# Patient Record
Sex: Male | Born: 1995 | Race: White | Hispanic: No | Marital: Married | Smoking: Never smoker
Health system: Southern US, Community
[De-identification: ages and names within clinical notes are randomized; demographics above are authoritative.]

## PROBLEM LIST (undated history)

## (undated) DIAGNOSIS — R002 Palpitations: Secondary | ICD-10-CM

## (undated) DIAGNOSIS — S42409B Unspecified fracture of lower end of unspecified humerus, initial encounter for open fracture: Secondary | ICD-10-CM

## (undated) DIAGNOSIS — I10 Essential (primary) hypertension: Secondary | ICD-10-CM

## (undated) DIAGNOSIS — R569 Unspecified convulsions: Secondary | ICD-10-CM

## (undated) DIAGNOSIS — T148XXA Other injury of unspecified body region, initial encounter: Secondary | ICD-10-CM

## (undated) DIAGNOSIS — R000 Tachycardia, unspecified: Secondary | ICD-10-CM

## (undated) DIAGNOSIS — S060X9A Concussion with loss of consciousness of unspecified duration, initial encounter: Secondary | ICD-10-CM

## (undated) HISTORY — DX: Tachycardia, unspecified: R00.0

## (undated) HISTORY — DX: Palpitations: R00.2

## (undated) HISTORY — DX: Essential (primary) hypertension: I10

---
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
## 2017-08-02 DIAGNOSIS — Z0001 Encounter for general adult medical examination with abnormal findings: Secondary | ICD-10-CM | POA: Diagnosis not present

## 2017-08-02 DIAGNOSIS — R251 Tremor, unspecified: Secondary | ICD-10-CM | POA: Diagnosis not present

## 2017-08-26 DIAGNOSIS — R61 Generalized hyperhidrosis: Secondary | ICD-10-CM | POA: Diagnosis not present

## 2017-09-08 DIAGNOSIS — I499 Cardiac arrhythmia, unspecified: Secondary | ICD-10-CM | POA: Diagnosis not present

## 2017-09-19 ENCOUNTER — Telehealth: Payer: Self-pay

## 2017-09-19 DIAGNOSIS — R002 Palpitations: Secondary | ICD-10-CM

## 2017-09-19 HISTORY — DX: Palpitations: R00.2

## 2017-09-19 NOTE — Telephone Encounter (Signed)
[facility]20[facility]17 with Dr Delton Seenelson.  Can not read holter report tried to call to request this again, office was closed.  Left note for Greta DoomRose Jacobs in chart prep to call in the morning to have these resent.

## 2017-10-06 ENCOUNTER — Encounter: Payer: Self-pay | Admitting: Cardiology

## 2017-10-14 ENCOUNTER — Ambulatory Visit: Payer: BLUE CROSS/BLUE SHIELD | Admitting: Cardiology

## 2017-10-14 ENCOUNTER — Encounter: Payer: Self-pay | Admitting: Cardiology

## 2017-10-14 ENCOUNTER — Encounter (INDEPENDENT_AMBULATORY_CARE_PROVIDER_SITE_OTHER): Payer: Self-pay

## 2017-10-14 VITALS — BP 138/90 | HR 78 | Ht 73.0 in | Wt 177.4 lb

## 2017-10-14 DIAGNOSIS — R002 Palpitations: Secondary | ICD-10-CM | POA: Diagnosis not present

## 2017-10-14 DIAGNOSIS — I10 Essential (primary) hypertension: Secondary | ICD-10-CM

## 2017-10-14 NOTE — Patient Instructions (Signed)
Medication Instructions:  Your provider recommends that you continue on your current medications as directed. Please refer to the Current Medication list given to you today.    Labwork: None  Testing/Procedures: Your provider has requested that you have an echocardiogram. Echocardiography is a painless test that uses sound waves to create images of your heart. It provides your doctor with information about the size and shape of your heart and how well your heart's chambers and valves are working. This procedure takes approximately one hour. There are no restrictions for this procedure.    Your physician has recommended that you wear an event monitor. Event monitors are medical devices that record the heart's electrical activity. Doctors most often us these monitors to diagnose arrhythmias. Arrhythmias are problems with the speed or rhythm of the heartbeat. The monitor is a small, portable device. You can wear one while you do your normal daily activities. This is usually used to diagnose what is causing palpitations/syncope (passing out).  Follow-Up: Your provider recommends that you schedule a follow-up appointment in 3 MONTHS with Dr. Delton SeeNelson.  Any Other Special Instructions Will Be Listed Below (If Applicable).     If you need a refill on your cardiac medications before your next appointment, please call your pharmacy.
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
## 2017-10-24 ENCOUNTER — Ambulatory Visit (HOSPITAL_COMMUNITY): Payer: BLUE CROSS/BLUE SHIELD | Attending: Cardiovascular Disease

## 2017-10-24 ENCOUNTER — Other Ambulatory Visit: Payer: Self-pay

## 2017-10-24 ENCOUNTER — Ambulatory Visit (INDEPENDENT_AMBULATORY_CARE_PROVIDER_SITE_OTHER): Payer: BLUE CROSS/BLUE SHIELD

## 2017-10-24 DIAGNOSIS — R002 Palpitations: Secondary | ICD-10-CM

## 2017-10-24 DIAGNOSIS — I10 Essential (primary) hypertension: Secondary | ICD-10-CM | POA: Diagnosis not present

## 2017-11-13 ENCOUNTER — Telehealth: Payer: Self-pay | Admitting: Cardiology

## 2017-11-13 NOTE — Telephone Encounter (Signed)
Received a call from monitor company that patient had a 7 sec run of NSVT (180 bpm) this morning around 5am. I attempted to contact the patient through the number listed in Epic which was his parents number. Father reports he is out of town working and gave cell number. Attempted to call, and VM left to call the office on call number. Will attempt to reach again.   Laverda PageLindsay Roberts NP

## 2017-11-14 NOTE — Telephone Encounter (Addendum)
Pt out of town working.  Called him on cell phone [phone]. Pt reports sleeping at time of event occurrence yesterday morning.  No symptoms reported, was not woken from sleep. He did report triggering an event on Saturday.  Informed that I would pull and review w/ Dr. Delton SeeNelson.   Monitor from Saturday 3:41 pm auto triggered event showing 4 beat VT, rate 197.  Pt reports feeling flutter/skipped beats/palpitations. Dr. Delton SeeNelson recommends EP consult in 1-2 weeks with Dr. Ladona Ridgelaylor.  Will have scheduler arrange w/ pt.   Pt aware office will call to arrange

## 2017-12-09 ENCOUNTER — Encounter: Payer: Self-pay | Admitting: Internal Medicine

## 2017-12-09 ENCOUNTER — Ambulatory Visit: Payer: BLUE CROSS/BLUE SHIELD | Admitting: Internal Medicine

## 2017-12-09 ENCOUNTER — Telehealth: Payer: Self-pay

## 2017-12-09 VITALS — BP 134/72 | HR 85 | Ht 73.0 in | Wt 179.0 lb

## 2017-12-09 DIAGNOSIS — R002 Palpitations: Secondary | ICD-10-CM

## 2017-12-09 DIAGNOSIS — I10 Essential (primary) hypertension: Secondary | ICD-10-CM

## 2017-12-09 NOTE — Telephone Encounter (Signed)
Left message for medical records at Dr. Carolee RotaPharr's office, requesting last 12 lead EKG for this Pt.  Left fax #.  Also requested our MR dept request EKG.

## 2017-12-09 NOTE — Patient Instructions (Addendum)
Medication Instructions:  Your physician recommends that you continue on your current medications as directed. Please refer to the Current Medication list given to you today.  Labwork: None ordered.   Testing/Procedures: None ordered.  Follow-Up: Your physician wants you to follow-up in: 6 months with Dr. Ladona Ridgelaylor.     Any Other Special Instructions Will Be Listed Below (If Applicable).  Avoid caffeine.  If you need a refill on your cardiac medications before your next appointment, please call your pharmacy.
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
## 2018-02-06 ENCOUNTER — Ambulatory Visit: Payer: BLUE CROSS/BLUE SHIELD | Admitting: Cardiology

## 2018-05-26 ENCOUNTER — Ambulatory Visit: Payer: BLUE CROSS/BLUE SHIELD | Admitting: Internal Medicine

## 2018-11-14 DIAGNOSIS — T1592XA Foreign body on external eye, part unspecified, left eye, initial encounter: Secondary | ICD-10-CM | POA: Diagnosis not present

## 2019-06-27 DIAGNOSIS — J029 Acute pharyngitis, unspecified: Secondary | ICD-10-CM | POA: Diagnosis not present

## 2023-07-29 ENCOUNTER — Ambulatory Visit (INDEPENDENT_AMBULATORY_CARE_PROVIDER_SITE_OTHER): Payer: BC Managed Care – PPO | Admitting: Physician Assistant

## 2023-07-29 ENCOUNTER — Encounter: Payer: Self-pay | Admitting: Physician Assistant

## 2023-07-29 VITALS — BP 168/102 | HR 114 | Temp 98.5°F | Ht 73.0 in | Wt 215.0 lb

## 2023-07-29 DIAGNOSIS — Z0000 Encounter for general adult medical examination without abnormal findings: Secondary | ICD-10-CM | POA: Diagnosis not present

## 2023-07-29 DIAGNOSIS — I10 Essential (primary) hypertension: Secondary | ICD-10-CM | POA: Diagnosis not present

## 2023-07-29 MED ORDER — VALSARTAN 80 MG PO TABS: 80.0000 mg | ORAL_TABLET | Freq: Every day | ORAL | 1 refills | Status: DC

## 2023-07-29 NOTE — Assessment & Plan Note (Addendum)
Suspicion 2/2 to anxiety, but h/o of medication use -will start with valsartan 80 mg , advised pt to monitor blood pressure at home -ordered cmp F/u 4 weeks
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
## 2023-08-01 LAB — LIPID PANEL
Chol/HDL Ratio: 2.5 ratio (ref 0.0–5.0)
Cholesterol, Total: 214 mg/dL — ABNORMAL HIGH (ref 100–199)
HDL: 86 mg/dL (ref >39)
LDL Chol Calc (NIH): 115 mg/dL — ABNORMAL HIGH (ref 0–99)
Triglycerides: 74 mg/dL (ref 0–149)
VLDL Cholesterol Cal: 13 mg/dL (ref 5–40)

## 2023-08-01 LAB — HEMOGLOBIN A1C
Est. average glucose Bld gHb Est-mCnc: 111 mg/dL
Hgb A1c MFr Bld: 5.5 % (ref 4.8–5.6)

## 2023-08-01 LAB — CBC WITH DIFFERENTIAL/PLATELET
Basophils Absolute: 0.1 10*3/uL (ref 0.0–0.2)
Basos: 1 %
EOS (ABSOLUTE): 0.1 10*3/uL (ref 0.0–0.4)
Eos: 1 %
Hematocrit: 50.3 % (ref 37.5–51.0)
Hemoglobin: 16.2 g/dL (ref 13.0–17.7)
Immature Grans (Abs): 0 10*3/uL (ref 0.0–0.1)
Immature Granulocytes: 0 %
Lymphocytes Absolute: 1.8 10*3/uL (ref 0.7–3.1)
Lymphs: 20 %
MCH: 30.9 pg (ref 26.6–33.0)
MCHC: 32.2 g/dL (ref 31.5–35.7)
MCV: 96 fL (ref 79–97)
Monocytes Absolute: 0.7 10*3/uL (ref 0.1–0.9)
Monocytes: 8 %
Neutrophils Absolute: 6.4 10*3/uL (ref 1.4–7.0)
Neutrophils: 70 %
Platelets: 293 10*3/uL (ref 150–450)
RBC: 5.25 x10E6/uL (ref 4.14–5.80)
RDW: 12 % (ref 11.6–15.4)
WBC: 9 10*3/uL (ref 3.4–10.8)

## 2023-08-01 LAB — COMPREHENSIVE METABOLIC PANEL
ALT: 19 [IU]/L (ref 0–44)
AST: 18 [IU]/L (ref 0–40)
Albumin: 4.9 g/dL (ref 4.3–5.2)
Alkaline Phosphatase: 77 [IU]/L (ref 44–121)
BUN/Creatinine Ratio: 11 (ref 9–20)
BUN: 11 mg/dL (ref 6–20)
Bilirubin Total: 0.3 mg/dL (ref 0.0–1.2)
CO2: 22 mmol/L (ref 20–29)
Calcium: 10.2 mg/dL (ref 8.7–10.2)
Chloride: 101 mmol/L (ref 96–106)
Creatinine, Ser: 0.99 mg/dL (ref 0.76–1.27)
Globulin, Total: 2.6 g/dL (ref 1.5–4.5)
Glucose: 106 mg/dL — ABNORMAL HIGH (ref 70–99)
Potassium: 4.5 mmol/L (ref 3.5–5.2)
Sodium: 139 mmol/L (ref 134–144)
Total Protein: 7.5 g/dL (ref 6.0–8.5)
eGFR: 107 mL/min/{1.73_m2} (ref >59)

## 2023-10-03 ENCOUNTER — Other Ambulatory Visit: Payer: Self-pay | Admitting: Physician Assistant

## 2023-10-03 DIAGNOSIS — I10 Essential (primary) hypertension: Secondary | ICD-10-CM

## 2024-02-21 ENCOUNTER — Telehealth: Payer: Self-pay | Admitting: Physician Assistant

## 2024-02-21 DIAGNOSIS — I10 Essential (primary) hypertension: Secondary | ICD-10-CM

## 2024-02-21 NOTE — Telephone Encounter (Signed)
 Copied from CRM [id number]. Topic: Clinical - Medication Refill >> Feb 21, 2024 11:58 AM Baldo Levan wrote: Medication:  valsartan  valsartan  (DIOVAN ) 80 MG tablet  Patient leaves tomorrow morning for a trip and will run out while out of town.   Has the patient contacted their pharmacy? Yes (Agent: If no, request that the patient contact the pharmacy for the refill. If patient does not wish to contact the pharmacy document the reason why and proceed with request.) (Agent: If yes, when and what did the pharmacy advise?)  This is the patient's preferred pharmacy:    [id number] - [place], [place] - [address] AT [facility] [address] [place] [address] Phone: [phone] Fax: [phone]  Is this the correct pharmacy for this prescription? Yes If no, delete pharmacy and type the correct one.   Has the prescription been filled recently? No  Is the patient out of the medication? Yes  Has the patient been seen for an appointment in the last year OR does the patient have an upcoming appointment? Yes  Can we respond through MyChart? Yes  Agent: Please be advised that Rx refills may take up to 3 business days. We ask that you follow-up with your pharmacy.

## 2024-02-22 MED ORDER — VALSARTAN 80 MG PO TABS: 80.0000 mg | ORAL_TABLET | Freq: Every day | ORAL | 0 refills | Status: DC

## 2024-02-22 NOTE — Progress Notes (Signed)
 Established patient visit   Patient: Randy Daniels   DOB: Aug 23, 1996   28 y.o. Male  MRN: [id number] Visit Date: 02/29/2024  Today's healthcare provider: Manuelita Flatness, PA-C   Cc. Htn fu  Subjective     Hypertension, follow-up  BP Readings from Last 3 Encounters:  02/29/24 (!) 142/100  07/29/23 (!) 168/102  12/09/17 134/72   Wt Readings from Last 3 Encounters:  02/29/24 210 lb 12.8 oz (95.6 kg)  07/29/23 215 lb (97.5 kg)  12/09/17 179 lb (81.2 kg)     Pt was last seen 07/29/23, started on valsartan  80 mg and advised to f/b in 4 weeks.   Outside blood pressures are 126/80; 137/82 but last checked 08/2023.  Pertinent labs Lab Results  Component Value Date   CHOL 214 (H) 07/29/2023   HDL 86 07/29/2023   LDLCALC 115 (H) 07/29/2023   TRIG 74 07/29/2023   CHOLHDL 2.5 07/29/2023   Lab Results  Component Value Date   NA 139 07/29/2023   K 4.5 07/29/2023   CREATININE 0.99 07/29/2023   EGFR 107 07/29/2023   GLUCOSE 106 (H) 07/29/2023     The ASCVD Risk score (Arnett DK, et al., 2019) failed to calculate for the following reasons:   The 2019 ASCVD risk score is only valid for ages 30 to 54  ---------------------------------------------------------------------------------------------------   Medications: Outpatient Medications Prior to Visit  Medication Sig   acetaminophen  (TYLENOL ) 325 MG tablet Take 325 mg by mouth every 6 (six) hours as needed. For pain   [DISCONTINUED] valsartan  (DIOVAN ) 80 MG tablet Take 1 tablet (80 mg total) by mouth daily. Need follow up appointment with PCP.   No facility-administered medications prior to visit.    Review of Systems  Constitutional:  Negative for fatigue and fever.  Respiratory:  Negative for cough and shortness of breath.   Cardiovascular:  Negative for chest pain, palpitations and leg swelling.  Neurological:  Negative for dizziness and headaches.       Objective    BP (!) 142/100   Pulse 91   Temp  98.1 F (36.7 C) (Oral)   Resp 18   Ht 6' 1 (1.854 m)   Wt 210 lb 12.8 oz (95.6 kg)   SpO2 99%   BMI 27.81 kg/m    Physical Exam Constitutional:      General: He is awake.     Appearance: He is well-developed.  HENT:     Head: Normocephalic.   Eyes:     Conjunctiva/sclera: Conjunctivae normal.    Cardiovascular:     Rate and Rhythm: Normal rate and regular rhythm.     Heart sounds: Normal heart sounds.  Pulmonary:     Effort: Pulmonary effort is normal.   Skin:    General: Skin is warm.   Neurological:     Mental Status: He is alert and oriented to person, place, and time.   Psychiatric:        Attention and Perception: Attention normal.        Mood and Affect: Mood normal.        Speech: Speech normal.        Behavior: Behavior is cooperative.      No results found for any visits on 02/29/24.  Assessment & Plan    Primary hypertension Assessment & Plan: Improved pressure in office. No recent readings at home. Pt concerned with orthostatic symptoms if increasing dose. Reports he works outside and is already dizzy in  the heat.  Encouraged electrolytes/fluids.  Cont valsartan  80 mg, encouraged to check at home , f/b 8 weeks  Orders: -     Basic metabolic panel with GFR -     Valsartan ; Take 1 tablet (80 mg total) by mouth daily.  Dispense: 90 tablet; Refill: 1  Moderate mixed hyperlipidemia not requiring statin therapy -     Lipid panel     Return in about 8 weeks (around 04/25/2024) for hypertension.       Manuelita Flatness, PA-C  [facility] Primary [facility] at [facility] [phone] (phone) [phone] (fax)  [facility]

## 2024-02-29 ENCOUNTER — Ambulatory Visit (INDEPENDENT_AMBULATORY_CARE_PROVIDER_SITE_OTHER): Admitting: Physician Assistant

## 2024-02-29 ENCOUNTER — Encounter: Payer: Self-pay | Admitting: Physician Assistant

## 2024-02-29 VITALS — BP 142/100 | HR 91 | Temp 98.1°F | Resp 18 | Ht 73.0 in | Wt 210.8 lb

## 2024-02-29 DIAGNOSIS — E782 Mixed hyperlipidemia: Secondary | ICD-10-CM

## 2024-02-29 DIAGNOSIS — I10 Essential (primary) hypertension: Secondary | ICD-10-CM

## 2024-02-29 MED ORDER — VALSARTAN 80 MG PO TABS: 80.0000 mg | ORAL_TABLET | Freq: Every day | ORAL | 1 refills | Status: DC

## 2024-02-29 NOTE — Assessment & Plan Note (Signed)
 Improved pressure in office. No recent readings at home. Pt concerned with orthostatic symptoms if increasing dose. Reports he works outside and is already dizzy in the heat.  Encouraged electrolytes/fluids.  Cont valsartan  80 mg, encouraged to check at home , f/b 8 weeks

## 2024-03-02 NOTE — Addendum Note (Signed)
 Addended by: TRUDY CURVIN RAMAN on: 03/02/2024 04:24 PM   Modules accepted: Orders

## 2024-03-05 ENCOUNTER — Ambulatory Visit: Admitting: Physician Assistant

## 2024-04-25 ENCOUNTER — Ambulatory Visit: Admitting: Physician Assistant

## 2024-07-05 ENCOUNTER — Telehealth: Payer: Self-pay | Admitting: Physician Assistant

## 2024-07-05 NOTE — Telephone Encounter (Signed)
 Lvm for him to schedule.

## 2024-07-05 NOTE — Telephone Encounter (Signed)
 Copied from CRM #[id number]. Topic: Appointments - Scheduling Inquiry for Clinic >> Jul 05, 2024 11:08 AM Vena HERO wrote: Reason for CRM: Pt wife is Duwaine Hsu and she is a pt of Dr Watt who spoke with her this morning and Dr Watt agreed to take this pt on as a new pt. He has a TOC appt scheduled with Harlene Jolly on 11/06 and is requesting to move this appt to Dr Coplands schedule. Please reach out via mychart to discuss times and dates with pt to get this completed.  I don't see any documentation of this. Is it ok to schedule?

## 2024-07-12 ENCOUNTER — Ambulatory Visit (INDEPENDENT_AMBULATORY_CARE_PROVIDER_SITE_OTHER): Admitting: Student

## 2024-07-12 ENCOUNTER — Encounter: Payer: Self-pay | Admitting: Student

## 2024-07-12 VITALS — BP 133/86 | HR 99 | Temp 98.6°F | Resp 18 | Ht 73.0 in | Wt 216.4 lb

## 2024-07-12 DIAGNOSIS — I10 Essential (primary) hypertension: Secondary | ICD-10-CM | POA: Diagnosis not present

## 2024-07-12 DIAGNOSIS — Z0000 Encounter for general adult medical examination without abnormal findings: Secondary | ICD-10-CM | POA: Diagnosis not present

## 2024-07-12 DIAGNOSIS — R739 Hyperglycemia, unspecified: Secondary | ICD-10-CM

## 2024-07-12 LAB — CBC WITH DIFFERENTIAL/PLATELET
Basophils Absolute: 0 K/uL (ref 0.0–0.1)
Basophils Relative: 0.2 % (ref 0.0–3.0)
Eosinophils Absolute: 0.1 K/uL (ref 0.0–0.7)
Eosinophils Relative: 1.4 % (ref 0.0–5.0)
HCT: 48.1 % (ref 39.0–52.0)
Hemoglobin: 16.2 g/dL (ref 13.0–17.0)
Lymphocytes Relative: 30.2 % (ref 12.0–46.0)
Lymphs Abs: 2.2 K/uL (ref 0.7–4.0)
MCHC: 33.6 g/dL (ref 30.0–36.0)
MCV: 92.8 fl (ref 78.0–100.0)
Monocytes Absolute: 0.4 K/uL (ref 0.1–1.0)
Monocytes Relative: 5.5 % (ref 3.0–12.0)
Neutro Abs: 4.5 K/uL (ref 1.4–7.7)
Neutrophils Relative %: 62.7 % (ref 43.0–77.0)
Platelets: 283 K/uL (ref 150.0–400.0)
RBC: 5.18 Mil/uL (ref 4.22–5.81)
RDW: 12.8 % (ref 11.5–15.5)
WBC: 7.2 K/uL (ref 4.0–10.5)

## 2024-07-12 LAB — COMPREHENSIVE METABOLIC PANEL WITH GFR
ALT: 19 U/L (ref 0–53)
AST: 17 U/L (ref 0–37)
Albumin: 4.9 g/dL (ref 3.5–5.2)
Alkaline Phosphatase: 60 U/L (ref 39–117)
BUN: 12 mg/dL (ref 6–23)
CO2: 27 meq/L (ref 19–32)
Calcium: 9.6 mg/dL (ref 8.4–10.5)
Chloride: 101 meq/L (ref 96–112)
Creatinine, Ser: 0.91 mg/dL (ref 0.40–1.50)
GFR: 114.46 mL/min (ref >60.00)
Glucose, Bld: 142 mg/dL — ABNORMAL HIGH (ref 70–99)
Potassium: 4.9 meq/L (ref 3.5–5.1)
Sodium: 138 meq/L (ref 135–145)
Total Bilirubin: 0.5 mg/dL (ref 0.2–1.2)
Total Protein: 7.4 g/dL (ref 6.0–8.3)

## 2024-07-12 LAB — LIPID PANEL
Cholesterol: 225 mg/dL — ABNORMAL HIGH (ref 0–200)
HDL: 76.8 mg/dL (ref >39.00)
LDL Cholesterol: 121 mg/dL — ABNORMAL HIGH (ref 0–99)
NonHDL: 148.67
Total CHOL/HDL Ratio: 3
Triglycerides: 136 mg/dL (ref 0.0–149.0)
VLDL: 27.2 mg/dL (ref 0.0–40.0)

## 2024-07-12 LAB — TSH: TSH: 2.52 u[IU]/mL (ref 0.35–5.50)

## 2024-07-12 LAB — HEMOGLOBIN A1C: Hgb A1c MFr Bld: 5.6 % (ref 4.6–6.5)

## 2024-07-12 MED ORDER — VALSARTAN 80 MG PO TABS: 80.0000 mg | ORAL_TABLET | Freq: Every day | ORAL | 3 refills | Status: AC
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
## 2024-07-12 NOTE — Assessment & Plan Note (Signed)
 Encouraged patient to maintain a healthy lifestyle, including regular physical activity, adequate hydration, and a well-balanced diet. Advised limiting processed foods, added sugars, and sodium intake. Reinforced the importance of sleep, stress management, and routine preventive care.

## 2024-07-12 NOTE — Assessment & Plan Note (Addendum)
 Well controlled, no changes to meds. Encouraged heart healthy diet such as the DASH diet and exercise as tolerated.   Rx refilled

## 2024-07-12 NOTE — Patient Instructions (Signed)
 Thank you for choosing [facility] at [facility] for your Primary Care needs. I am excited for the opportunity to partner with you to meet your health care goals. It was a pleasure meeting you today!  Information on diet, exercise, and health maintenance recommendations are listed below. This is information to help you be sure you are on track for optimal health and monitoring.   Please look over this and let us  know if you have any questions or if you have completed any of the health maintenance outside of [facility] so that we can be sure your records are up to date.  ___________________________________________________________  MyChart:  For all urgent or time sensitive needs we ask that you please call the office to avoid delays. Our number is [phone]. MyChart is not constantly monitored and due to the large volume of messages a day, replies may take up to 72 business hours.  MyChart Policy: MyChart allows for you to see your visit notes, after visit summary, provider recommendations, lab and tests results, make an appointment, request refills, and contact your provider or the office for non-urgent questions or concerns. Providers are seeing patients during normal business hours and do not have built in time to review MyChart messages.  We ask that you allow a minimum of 3 business days for responses to [employer]. For this reason, please do not send urgent requests through MyChart. Please call the office at [phone]. New and ongoing conditions may require a visit. We have virtual and in-person visits available for your convenience.  Complex MyChart concerns may require a visit. Your provider may request you schedule a virtual or in-person visit to ensure we are providing the best care possible. MyChart messages sent after 11:00 AM on Friday may not be received by the provider until Monday morning.    Lab and Test Results: You will receive your lab and test  results on MyChart as soon as they are completed and results have been sent by the lab or testing facility. Due to this service, you will receive your results BEFORE your provider.  I review lab and test results each morning prior to seeing patients. Some results require collaboration with other providers to ensure you are receiving the most appropriate care. For this reason, we ask that you please allow a minimum of 3-5 business days from the time that ALL results have been received for your provider to receive and review lab and test results and contact you about these.  Most lab and test result comments from the provider will be sent through MyChart. Your provider may recommend changes to the plan of care, follow-up visits, repeat testing, ask questions, or request an office visit to discuss these results. You may reply directly to this message or call the office to provide information for the provider or set up an appointment. In some instances, you will be called with test results and recommendations. Please let us  know if this is preferred and we will make note of this in your chart to provide this for you.    If you have not heard a response to your lab or test results in 5 business days from all results returning to MyChart, please call the office to let us  know. We ask that you please avoid calling prior to this time unless there is an emergent concern. Due to high call volumes, this can delay the resulting process.  After Hours: For all non-emergency after hours needs, please  call the office at [phone] and select the option to reach the on-call  service. On-call services are shared between multiple [facility] offices and therefore it will not be possible to speak directly with your provider. On-call providers may provide medical advice and recommendations, but are unable to provide refills for maintenance medications.  For all emergency or urgent medical needs after normal business hours, we  recommend that you seek care at the closest Urgent Care or Emergency Department to ensure appropriate treatment in a timely manner.  [facility] has a 24 hour emergency room located on the ground floor for your convenience.   Urgent Concerns During the Business Day Providers are seeing patients from 8AM to 5PM with a busy schedule and are most often not able to respond to non-urgent calls until the end of the day or the next business day. If you should have URGENT concerns during the day, please call and speak to the nurse or schedule a same day appointment so that we can address your concern without delay.   Thank you, again, for choosing me as your health care partner. I appreciate your trust and look forward to learning more about you!   Ica Daye L. Korene Pert, DNP, AGNP-C ___________________________________________________________  Health Maintenance Recommendations Screening Testing Mammogram Every 1-2 years based on history and risk factors Starting at age 7 Pap Smear Ages 21-39 every 3 years Ages 64-65 every 5 years with HPV testing More frequent testing may be required based on results and history Colon Cancer Screening Every 1-10 years based on test performed, risk factors, and history Starting at age 86 Bone Density Screening Every 2-10 years based on history Starting at age 50 for women Recommendations for men differ based on medication usage, history, and risk factors AAA Screening One time ultrasound Men 1-57 years old who have ever smoked Lung Cancer Screening Low Dose Lung CT every 12 months Age 27-80 years with a 20 pack-year smoking history who still smoke or who have quit within the last 15 years  Screening Labs Routine  Labs: Complete Blood Count (CBC), Complete Metabolic Panel (CMP), Cholesterol (Lipid Panel) Every 6-12 months based on history and medications May be recommended more frequently based on current conditions or previous  results Hemoglobin A1c Lab Every 3-12 months based on history and previous results Starting at age 29 or earlier with diagnosis of diabetes, high cholesterol, BMI >26, and/or risk factors Frequent monitoring for patients with diabetes to ensure blood sugar control Thyroid  Panel  Every 6 months based on history, symptoms, and risk factors May be repeated more often if on medication HIV One time testing for all patients 88 and older May be repeated more frequently for patients with increased risk factors or exposure Hepatitis C One time testing for all patients 78 and older May be repeated more frequently for patients with increased risk factors or exposure Gonorrhea, Chlamydia Every 12 months for all sexually active persons 13-24 years Additional monitoring may be recommended for those who are considered high risk or who have symptoms PSA Men 66-41 years old with risk factors Additional screening may be recommended from age 36-69 based on risk factors, symptoms, and history  Vaccine Recommendations Tetanus Booster All adults every 10 years Flu Vaccine All patients 6 months and older every year COVID Vaccine All patients 12 years and older Initial dosing with booster May recommend additional booster based on age and health history HPV Vaccine 2 doses all patients age 35-26 Dosing may be considered for  patients over 26 Shingles Vaccine (Shingrix) 2 doses all adults 50 years and older Pneumonia (Pneumovax 23) All adults 65 years and older May recommend earlier dosing based on health history Pneumonia (Prevnar 18) All adults 65 years and older Dosed 1 year after Pneumovax 23 Pneumonia (Prevnar 20) All adults 65 years and older (adults 19-64 with certain conditions or risk factors) 1 dose  For those who have not received Prevnar 13 vaccine previously   Additional Screening, Testing, and Vaccinations may be recommended on an individualized basis based on family history, health  history, risk factors, and/or exposure.  __________________________________________________________  Diet Recommendations for All Patients  I recommend that all patients maintain a diet low in saturated fats, carbohydrates, and cholesterol. While this can be challenging at first, it is not impossible and small changes can make big differences.  Things to try: Decreasing the amount of soda, sweet tea, and/or juice to one or less per day and replace with water While water is always the first choice, if you do not like water you may consider adding a water additive without sugar to improve the taste other sugar free drinks Replace potatoes with a brightly colored vegetable  Use healthy oils, such as canola oil or olive oil, instead of butter or hard margarine Limit your bread intake to two pieces or less a day Replace regular pasta with low carb pasta options Bake, broil, or grill foods instead of frying Monitor portion sizes  Eat smaller, more frequent meals throughout the day instead of large meals  An important thing to remember is, if you love foods that are not great for your health, you don't have to give them up completely. Instead, allow these foods to be a reward when you have done well. Allowing yourself to still have special treats every once in a while is a nice way to tell yourself thank you for working hard to keep yourself healthy.   Also remember that every day is a new day. If you have a bad day and fall off the wagon, you can still climb right back up and keep moving along on your journey!  We have resources available to help you!  Some websites that may be helpful include: www.http://www.wall-moore.info/  Www.VeryWellFit.com _____________________________________________________________  Activity Recommendations for All Patients  I recommend that all adults get at least 30 minutes of moderate physical activity that elevates your heart rate at least 5 days out of the week.  Some  examples include: Walking or jogging at a pace that allows you to carry on a conversation Cycling (stationary bike or outdoors) Water aerobics Yoga Weight lifting Dancing If physical limitations prevent you from putting stress on your joints, exercise in a pool or seated in a chair are excellent options.  Do determine your MAXIMUM heart rate for activity: 220 - YOUR AGE = MAX Heart Rate   Remember! Do not push yourself too hard.  Start slowly and build up your pace, speed, weight, time in exercise, etc.  Allow your body to rest between exercise and get good sleep. You will need more water than normal when you are exerting yourself. Do not wait until you are thirsty to drink. Drink with a purpose of getting in at least 8, 8 ounce glasses of water a day plus more depending on how much you exercise and sweat.    If you begin to develop dizziness, chest pain, abdominal pain, jaw pain, shortness of breath, headache, vision changes, lightheadedness, or other concerning symptoms, stop  the activity and allow your body to rest. If your symptoms are severe, seek emergency evaluation immediately. If your symptoms are concerning, but not severe, please let us  know so that we can recommend further evaluation.
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
## 2024-07-13 ENCOUNTER — Ambulatory Visit: Payer: Self-pay | Admitting: Student

## 2024-07-13 LAB — HIV ANTIBODY (ROUTINE TESTING W REFLEX)
HIV 1&2 Ab, 4th Generation: NONREACTIVE
HIV FINAL INTERPRETATION: NEGATIVE

## 2024-07-13 LAB — HEPATITIS C ANTIBODY: Hepatitis C Ab: NONREACTIVE

## 2024-08-01 ENCOUNTER — Other Ambulatory Visit: Payer: Self-pay

## 2025-07-17 ENCOUNTER — Encounter: Admitting: Student
